# Patient Record
Sex: Female | Born: 1990 | Race: White | Hispanic: No | Marital: Single | Smoking: Current every day smoker
Health system: Southern US, Community
[De-identification: ages and names within clinical notes are randomized; demographics above are authoritative.]

## PROBLEM LIST (undated history)

## (undated) DIAGNOSIS — F32A Depression, unspecified: Secondary | ICD-10-CM

## (undated) DIAGNOSIS — F909 Attention-deficit hyperactivity disorder, unspecified type: Secondary | ICD-10-CM

## (undated) DIAGNOSIS — F419 Anxiety disorder, unspecified: Secondary | ICD-10-CM

## (undated) DIAGNOSIS — F329 Major depressive disorder, single episode, unspecified: Secondary | ICD-10-CM

## (undated) HISTORY — DX: Major depressive disorder, single episode, unspecified: F32.9

## (undated) HISTORY — DX: Depression, unspecified: F32.A

## (undated) HISTORY — DX: Anxiety disorder, unspecified: F41.9

## (undated) HISTORY — DX: Attention-deficit hyperactivity disorder, unspecified type: F90.9

## (undated) HISTORY — PX: NO PAST SURGERIES: SHX2092

---

## 2008-02-04 ENCOUNTER — Ambulatory Visit: Payer: Self-pay | Admitting: Internal Medicine

## 2009-08-05 ENCOUNTER — Ambulatory Visit: Payer: Self-pay | Admitting: Neurology

## 2010-09-13 ENCOUNTER — Ambulatory Visit: Payer: Self-pay | Admitting: Internal Medicine

## 2010-11-26 ENCOUNTER — Ambulatory Visit: Payer: Self-pay | Admitting: Otolaryngology

## 2011-01-02 ENCOUNTER — Ambulatory Visit: Payer: Self-pay | Admitting: Internal Medicine

## 2011-01-16 ENCOUNTER — Ambulatory Visit: Payer: Self-pay | Admitting: Internal Medicine

## 2011-12-14 ENCOUNTER — Ambulatory Visit: Payer: Self-pay | Admitting: Family Medicine

## 2012-01-29 ENCOUNTER — Emergency Department: Payer: Self-pay

## 2012-02-05 ENCOUNTER — Emergency Department: Payer: Self-pay | Admitting: Emergency Medicine

## 2014-10-11 DIAGNOSIS — F332 Major depressive disorder, recurrent severe without psychotic features: Secondary | ICD-10-CM | POA: Insufficient documentation

## 2014-10-11 DIAGNOSIS — F988 Other specified behavioral and emotional disorders with onset usually occurring in childhood and adolescence: Secondary | ICD-10-CM | POA: Insufficient documentation

## 2014-12-06 ENCOUNTER — Encounter: Payer: Self-pay | Admitting: Psychiatry

## 2014-12-06 ENCOUNTER — Ambulatory Visit (INDEPENDENT_AMBULATORY_CARE_PROVIDER_SITE_OTHER): Payer: BLUE CROSS/BLUE SHIELD | Admitting: Psychiatry

## 2014-12-06 VITALS — BP 124/88 | HR 62 | Temp 98.2°F | Ht 66.5 in | Wt 240.8 lb

## 2014-12-06 DIAGNOSIS — F329 Major depressive disorder, single episode, unspecified: Secondary | ICD-10-CM | POA: Insufficient documentation

## 2014-12-06 DIAGNOSIS — F1910 Other psychoactive substance abuse, uncomplicated: Secondary | ICD-10-CM | POA: Insufficient documentation

## 2014-12-06 DIAGNOSIS — F331 Major depressive disorder, recurrent, moderate: Secondary | ICD-10-CM | POA: Insufficient documentation

## 2014-12-06 DIAGNOSIS — F411 Generalized anxiety disorder: Secondary | ICD-10-CM

## 2014-12-06 DIAGNOSIS — F902 Attention-deficit hyperactivity disorder, combined type: Secondary | ICD-10-CM | POA: Diagnosis not present

## 2014-12-06 MED ORDER — BUPROPION HCL ER (XL) 300 MG PO TB24: 300.0000 mg | ORAL_TABLET | Freq: Every day | ORAL | Status: DC

## 2014-12-06 MED ORDER — AMPHETAMINE-DEXTROAMPHET ER 15 MG PO CP24: 15.0000 mg | ORAL_CAPSULE | Freq: Two times a day (BID) | ORAL | Status: DC

## 2014-12-06 MED ORDER — CITALOPRAM HYDROBROMIDE 20 MG PO TABS: 20.0000 mg | ORAL_TABLET | Freq: Every day | ORAL | Status: DC
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
## 2014-12-09 ENCOUNTER — Other Ambulatory Visit: Payer: Self-pay

## 2014-12-09 NOTE — Telephone Encounter (Signed)
spoke with [facility] in regards to getting prior authorization needed for the adderall xr 15mg  take 1 capsule by mouth twice daily with breakfast and lunch and per the insurance they will not authorized because there is not enough documentation on office note to support why patient needs medication , what other medications the patient has tired and why patient needs to take the medication twice a day instead of giving patient adderall xr 30mg  i po qd.   I had previously sent in the last three office notes and still the documentation was not supportive.  I have updated the adderall xr to the 30 mg taking 1 po qd (ready if you want to send in that order and see if it is approved them.    Please advise.

## 2014-12-15 NOTE — Telephone Encounter (Signed)
Yes, please. I do not see any other solution to this. She has said that the 30 mg strength once a day makes her feel bad but it seems to be the best we can do. I am happy to sign the prescription and if it gets printed out and we can send it to her if she is willing to try it.

## 2014-12-15 NOTE — Telephone Encounter (Signed)
was able to get a prior [place] appealed and it was approved for the 15mg  twice daily.

## 2014-12-28 ENCOUNTER — Telehealth: Payer: Self-pay | Admitting: Psychiatry

## 2015-01-09 NOTE — Telephone Encounter (Signed)
adderall xr  was approved -

## 2015-05-01 ENCOUNTER — Other Ambulatory Visit: Payer: Self-pay | Admitting: Psychiatry

## 2015-05-01 MED ORDER — BUPROPION HCL ER (XL) 300 MG PO TB24: 300.0000 mg | ORAL_TABLET | Freq: Every day | ORAL | Status: DC

## 2015-05-04 ENCOUNTER — Ambulatory Visit (INDEPENDENT_AMBULATORY_CARE_PROVIDER_SITE_OTHER): Payer: BLUE CROSS/BLUE SHIELD | Admitting: Psychiatry

## 2015-05-04 ENCOUNTER — Encounter: Payer: Self-pay | Admitting: Psychiatry

## 2015-05-04 VITALS — BP 148/98 | HR 89 | Temp 98.4°F | Ht 66.5 in | Wt 239.8 lb

## 2015-05-04 DIAGNOSIS — F411 Generalized anxiety disorder: Secondary | ICD-10-CM | POA: Diagnosis not present

## 2015-05-04 DIAGNOSIS — F902 Attention-deficit hyperactivity disorder, combined type: Secondary | ICD-10-CM | POA: Diagnosis not present

## 2015-05-04 DIAGNOSIS — F331 Major depressive disorder, recurrent, moderate: Secondary | ICD-10-CM | POA: Diagnosis not present

## 2015-05-04 MED ORDER — AMPHETAMINE-DEXTROAMPHET ER 15 MG PO CP24: 15.0000 mg | ORAL_CAPSULE | Freq: Two times a day (BID) | ORAL | Status: DC

## 2015-05-04 MED ORDER — CITALOPRAM HYDROBROMIDE 20 MG PO TABS: 40.0000 mg | ORAL_TABLET | Freq: Every day | ORAL | Status: DC

## 2015-05-17 ENCOUNTER — Telehealth: Payer: Self-pay

## 2015-05-17 NOTE — Telephone Encounter (Signed)
left message asking patient to call office back in regards to how her blood pressure was doing.  when patient came into office her bp was high 148/ 98.

## 2015-05-18 NOTE — Progress Notes (Signed)
[facility] MD Progress Note  05/18/2015 4:55 PM Hannah Mendez  MRN:  [id number] Subjective:  Follow-up for 24 year old woman with multiple complaints of depression and anxiety as well as chronic ADHD. Depression has been worse. Feeling more down and sad. Also irritable. Having a little trouble sleeping at night. Increased anxiety. No suicidal or homicidal ideation. No sign of psychotic thinking. Her medication for ADHD is not ideal but as far as what her insurance will cover it seems to be working fairly well. No other new physical complaints. She is feeling socially frustrated with her current job and personal situation Principal Problem: @ Diagnosis:   Patient Active Problem List   Diagnosis Date Noted  . 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 Depression, major, recurrent, moderate (HCC) [F33.1] 12/06/2014  . Anxiety, generalized [F41.1] 12/06/2014  . 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 ADD (attention deficit disorder) [F90.9] 10/11/2014  . Major depressive disorder, recurrent severe without psychotic features (HCC) [F33.2] 10/11/2014   Total Time spent with patient: 25 minutes  Past Psychiatric History: Past history of chronic depression and anxiety response well to medication of suicide attempts  Past Medical History:  Past Medical History  Diagnosis Date  . ADHD (attention deficit hyperactivity disorder)   . Anxiety   . Depression    History reviewed. No pertinent past surgical history. Family History:  Family History  Problem Relation Age of Onset  . Anxiety disorder Mother   . Depression Mother   . OCD Mother   . Thyroid disease Maternal Aunt   . Thyroid disease Maternal Grandmother    Family Psychiatric  History: Family history of anxiety Social History:  History  Alcohol Use  . 26.4 oz/week  . 0 Standard drinks or equivalent, 14 Glasses of wine, 24 Cans of beer, 6 Shots of liquor per week     History  Drug Use  . Yes   . Special: Marijuana    Comment: EVERYNIGHT    Social History   Social History  . Marital Status: Single    Spouse Name: N/A  . Number of Children: N/A  . Years of Education: N/A   Social History Main Topics  . Smoking status: Current Every Day Smoker -- 0.50 packs/day    Types: Cigarettes    Start date: 12/05/2009  . Smokeless tobacco: Never Used  . Alcohol Use: 26.4 oz/week    0 Standard drinks or equivalent, 14 Glasses of wine, 24 Cans of beer, 6 Shots of liquor per week  . Drug Use: Yes    Special: Marijuana     Comment: EVERYNIGHT  . Sexual Activity: No   Other Topics Concern  . None   Social History Narrative   Additional Social History:                         Sleep: Good  Appetite:  Good  Current Medications: Current Outpatient Prescriptions  Medication Sig Dispense Refill  . amphetamine-dextroamphetamine (ADDERALL XR) 15 MG 24 hr capsule Take 1 capsule by mouth 2 (two) times daily with breakfast and lunch. 60 capsule 0  . buPROPion (WELLBUTRIN XL) 300 MG 24 hr tablet Take 1 tablet (300 mg total) by mouth daily. 30 tablet 3  . citalopram (CELEXA) 20 MG tablet Take 2 tablets (40 mg total) by mouth daily. 1 (one) Tablet per day plus one per day extra during menstral period 60 tablet 3  . amphetamine-dextroamphetamine (ADDERALL XR) 15 MG  24 hr capsule Take 1 capsule by mouth 2 (two) times daily with breakfast and lunch. (Patient not taking: Reported on 05/04/2015) 60 capsule 0  . amphetamine-dextroamphetamine (ADDERALL XR) 15 MG 24 hr capsule Take 1 capsule by mouth 2 (two) times daily with breakfast and lunch. (Patient not taking: Reported on 05/04/2015) 60 capsule 0  . amphetamine-dextroamphetamine (ADDERALL XR) 20 MG 24 hr capsule Take 1 capsule by mouth daily.     No current facility-administered medications for this visit.    Lab Results: No results found for this or any previous visit (from the past 48 hour(s)).  Physical Findings: AIMS:  , ,   ,  ,    CIWA:    COWS:     Musculoskeletal: Strength & Muscle Tone: within normal limits Gait & Station: normal Patient leans: N/A  Psychiatric Specialty Exam: ROS  Blood pressure 148/98, pulse 89, temperature 98.4 F (36.9 C), temperature source Tympanic, height 5' 6.5" (1.689 m), weight 239 lb 12.8 oz (108.773 kg), last menstrual period 04/03/2015, SpO2 96 %.Body mass index is 38.13 kg/(m^2).  General Appearance: Casual  Eye Contact::  Good  Speech:  Clear and Coherent  Volume:  Normal  Mood:  Dysphoric  Affect:  Depressed  Thought Process:  Coherent  Orientation:  Full (Time, Place, and Person)  Thought Content:  Negative  Suicidal Thoughts:  No  Homicidal Thoughts:  No  Memory:  Immediate;   Good Recent;   Good Remote;   Good  Judgement:  Fair  Insight:  Fair  Psychomotor Activity:  Decreased  Concentration:  Fair  Recall:  [employer]  Fund of Knowledge:Fair  Language: Fair  Akathisia:  No  Handed:  Right  AIMS (if indicated):     Assets:  Communication Skills Desire for Improvement Leisure Time Physical Health Resilience  ADL's:  Intact  Cognition: WNL  Sleep:      Treatment Plan Summary: Medication management and Plan reviewed medication plan. Her depression is getting worse. Psychoeducation completed. Encouraged her to start seeing a therapist again. Add citalopram and her Wellbutrin and continue that for her depression.  Her ADHD is stable continue current medicine. Review the appropriate timing of doses.Follow-up in 3 months.Anxiety level is been worse. Citalopram may help with that. Again encouraged her to see a therapist.  Mordecai RasmussenJohn  05/18/2015, 4:55 PM

## 2015-05-18 NOTE — Telephone Encounter (Signed)
left another message for patient to call our office back.

## 2015-05-29 NOTE — Telephone Encounter (Signed)
left message for patient to call us back in regards to her blood pressure asked pt to please call our office back

## 2015-08-03 ENCOUNTER — Ambulatory Visit (INDEPENDENT_AMBULATORY_CARE_PROVIDER_SITE_OTHER): Payer: BLUE CROSS/BLUE SHIELD | Admitting: Psychiatry

## 2015-08-03 ENCOUNTER — Encounter: Payer: Self-pay | Admitting: Psychiatry

## 2015-08-03 VITALS — BP 140/90 | HR 94 | Temp 98.5°F | Ht 66.5 in | Wt 242.8 lb

## 2015-08-03 DIAGNOSIS — F902 Attention-deficit hyperactivity disorder, combined type: Secondary | ICD-10-CM | POA: Diagnosis not present

## 2015-08-03 DIAGNOSIS — F411 Generalized anxiety disorder: Secondary | ICD-10-CM | POA: Diagnosis not present

## 2015-08-03 DIAGNOSIS — F331 Major depressive disorder, recurrent, moderate: Secondary | ICD-10-CM | POA: Diagnosis not present

## 2015-08-03 MED ORDER — AMPHETAMINE-DEXTROAMPHET ER 10 MG PO CP24: 10.0000 mg | ORAL_CAPSULE | Freq: Two times a day (BID) | ORAL | Status: DC

## 2015-08-03 MED ORDER — BUPROPION HCL ER (XL) 300 MG PO TB24: 300.0000 mg | ORAL_TABLET | Freq: Every day | ORAL | Status: DC

## 2015-08-03 MED ORDER — CITALOPRAM HYDROBROMIDE 20 MG PO TABS: 20.0000 mg | ORAL_TABLET | Freq: Every day | ORAL | Status: DC

## 2015-08-04 NOTE — Progress Notes (Signed)
[facility] MD Progress Note  08/04/2015 4:31 PM Hannah Mendez  MRN:  [id number] Subjective:  She reports in some ways feeling more anxious. More jittery at times. Depression is perhaps better and panic attacks are better but increasing the citalopram seems to of made her more nervous. She still feels that the Adderall dose is not correct. No suicidal thoughts but overall a lot of nervousness and seems unhappy. Complains of not being able to get things done. She knows that she needs to see a therapist. Principal Problem: @ Diagnosis:   Patient Active Problem List   Diagnosis Date Noted  . 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 Depression, major, recurrent, moderate (HCC) [F33.1] 12/06/2014  . Anxiety, generalized [F41.1] 12/06/2014  . 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 ADD (attention deficit disorder) [F90.9] 10/11/2014  . Major depressive disorder, recurrent severe without psychotic features (HCC) [F33.2] 10/11/2014   Total Time spent with patient: 30 minutes  Past Psychiatric History: patient has a history of ADHD chronic recurrent depression history of cannabis abuse ongoing  Past Medical History:  Past Medical History  Diagnosis Date  . ADHD (attention deficit hyperactivity disorder)   . Anxiety   . Depression    History reviewed. No pertinent past surgical history. Family History:  Family History  Problem Relation Age of Onset  . Anxiety disorder Mother   . Depression Mother   . OCD Mother   . Thyroid disease Maternal Aunt   . Thyroid disease Maternal Grandmother    Family Psychiatric  History: family history of some anxiety no severe illness Social History:  History  Alcohol Use  . 26.4 oz/week  . 0 Standard drinks or equivalent, 14 Glasses of wine, 24 Cans of beer, 6 Shots of liquor per week     History  Drug Use  . Yes  . Special: Marijuana    Comment: EVERYNIGHT    Social History   Social History  . Marital  Status: Single    Spouse Name: N/A  . Number of Children: N/A  . Years of Education: N/A   Social History Main Topics  . Smoking status: Current Every Day Smoker -- 0.50 packs/day    Types: Cigarettes    Start date: 12/05/2009  . Smokeless tobacco: Never Used  . Alcohol Use: 26.4 oz/week    0 Standard drinks or equivalent, 14 Glasses of wine, 24 Cans of beer, 6 Shots of liquor per week  . Drug Use: Yes    Special: Marijuana     Comment: EVERYNIGHT  . Sexual Activity: No   Other Topics Concern  . None   Social History Narrative   Additional Social History:                         Sleep: Fair  Appetite:  Fair  Current Medications: Current Outpatient Prescriptions  Medication Sig Dispense Refill  . amphetamine-dextroamphetamine (ADDERALL XR) 10 MG 24 hr capsule Take 1 capsule (10 mg total) by mouth 2 (two) times daily with breakfast and lunch. 60 capsule 0  . buPROPion (WELLBUTRIN XL) 300 MG 24 hr tablet Take 1 tablet (300 mg total) by mouth daily. 30 tablet 3  . cetirizine (ZYRTEC) 10 MG tablet Take 10 mg by mouth daily.    . citalopram (CELEXA) 20 MG tablet Take 1 tablet (20 mg total) by mouth daily. 1 (one) Tablet per day plus one per day extra during menstral period 30 tablet  3   No current facility-administered medications for this visit.    Lab Results: No results found for this or any previous visit (from the past 48 hour(s)).  Blood Alcohol level:  No results found for: [facility]  Physical Findings: AIMS:  , ,  ,  ,    CIWA:    COWS:     Musculoskeletal: Strength & Muscle Tone: within normal limits Gait & Station: normal Patient leans: N/A  Psychiatric Specialty Exam: ROS  Blood pressure 140/90, pulse 94, temperature 98.5 F (36.9 C), temperature source Tympanic, height 5' 6.5" (1.689 m), weight 242 lb 12.8 oz (110.133 kg), last menstrual period 07/03/2015, SpO2 95 %.Body mass index is 38.61 kg/(m^2).  General Appearance: Casual  Eye Contact::   Good  Speech:  Normal Rate  Volume:  Normal  Mood:  Dysphoric  Affect:  Flat  Thought Process:  Coherent  Orientation:  Full (Time, Place, and Person)  Thought Content:  Negative  Suicidal Thoughts:  No  Homicidal Thoughts:  No  Memory:  Immediate;   Good Recent;   Good Remote;   Good  Judgement:  Good  Insight:  Fair  Psychomotor Activity:  Decreased  Concentration:  Fair  Recall:  [employer] of Knowledge:Fair  Language: Fair  Akathisia:  Negative  Handed:  Right  AIMS (if indicated):     Assets:  Communication Skills Desire for Improvement Financial Resources/Insurance Housing Physical Health Resilience  ADL's:  Intact  Cognition: WNL  Sleep:      Treatment Plan Summary: Medication management and Plan patient is strongly encouraged to get into seeing a therapist. Meanwhile we will change her Adderall by cutting back a little bit to 10 mg twice a day since she currently thinks the 15 twice a day is too much. We will cut back the citalopram to 20 mg again. I'm not going to add anything else. She implied that she wanted me to add Xanax but given her already regular use of marijuana and multiple drugs I'm very uncomfortable with that. Follow-up in 3 months.  Mordecai Rasmussen, MD 08/04/2015, 4:31 PM

## 2015-10-10 ENCOUNTER — Other Ambulatory Visit: Payer: Self-pay

## 2015-10-10 NOTE — Telephone Encounter (Signed)
pt called left a message that she needs a refill on adderall.

## 2015-10-12 ENCOUNTER — Telehealth: Payer: Self-pay

## 2015-10-12 NOTE — Telephone Encounter (Signed)
RECEIVED FAX STATIN THAT ADDERALL XR 10MG  WAS APPROVED EFFECTIVE DATES 10-09-15 TO  10-07-16

## 2015-12-13 ENCOUNTER — Other Ambulatory Visit: Payer: Self-pay | Admitting: Psychiatry

## 2015-12-13 MED ORDER — AMPHETAMINE-DEXTROAMPHET ER 10 MG PO CP24: 10.0000 mg | ORAL_CAPSULE | Freq: Two times a day (BID) | ORAL | Status: DC

## 2015-12-13 NOTE — Telephone Encounter (Signed)
I am printing a prescription. I will sign it and we can mail it to her

## 2015-12-14 NOTE — Telephone Encounter (Signed)
mailed out certified return receipt today  12-14-15 tracking # [id number]

## 2015-12-26 ENCOUNTER — Other Ambulatory Visit: Payer: Self-pay | Admitting: Psychiatry

## 2016-01-21 ENCOUNTER — Other Ambulatory Visit: Payer: Self-pay | Admitting: Psychiatry

## 2016-01-22 ENCOUNTER — Telehealth: Payer: Self-pay

## 2016-01-22 NOTE — Telephone Encounter (Signed)
pt called need refill on medications.

## 2016-01-22 NOTE — Telephone Encounter (Signed)
called in rx for wellbutrin and celexa.  pt still needs rx for adderall

## 2016-01-28 ENCOUNTER — Other Ambulatory Visit: Payer: Self-pay | Admitting: Psychiatry

## 2016-01-28 MED ORDER — AMPHETAMINE-DEXTROAMPHET ER 10 MG PO CP24: 10.0000 mg | ORAL_CAPSULE | Freq: Two times a day (BID) | ORAL | 0 refills | Status: DC

## 2016-01-29 NOTE — Telephone Encounter (Signed)
no answer could not leave a voice message.  dr. Toni Amendclapacs did 3 rx for adderall xr 10mg .  id [id number] order  [id number],  [id number] order # [id number] not to be filled until after 02-26-16, and id # [id number] order # [id number] not to be filled until after 10-10

## 2016-02-12 ENCOUNTER — Ambulatory Visit (INDEPENDENT_AMBULATORY_CARE_PROVIDER_SITE_OTHER): Payer: BLUE CROSS/BLUE SHIELD | Admitting: Psychiatry

## 2016-02-12 ENCOUNTER — Encounter: Payer: Self-pay | Admitting: Psychiatry

## 2016-02-12 VITALS — BP 136/89 | HR 77 | Temp 98.8°F | Ht 66.5 in | Wt 236.8 lb

## 2016-02-12 DIAGNOSIS — F902 Attention-deficit hyperactivity disorder, combined type: Secondary | ICD-10-CM | POA: Diagnosis not present

## 2016-02-12 DIAGNOSIS — F411 Generalized anxiety disorder: Secondary | ICD-10-CM

## 2016-02-12 MED ORDER — CITALOPRAM HYDROBROMIDE 20 MG PO TABS: ORAL_TABLET | ORAL | 5 refills | Status: DC

## 2016-02-12 MED ORDER — AMPHETAMINE-DEXTROAMPHET ER 10 MG PO CP24: 10.0000 mg | ORAL_CAPSULE | Freq: Two times a day (BID) | ORAL | 0 refills | Status: DC

## 2016-02-12 MED ORDER — BUPROPION HCL ER (XL) 300 MG PO TB24: ORAL_TABLET | ORAL | 5 refills | Status: DC

## 2016-02-12 NOTE — Progress Notes (Signed)
Follow-up for patient with ADHD and chronic anxiety and dysthymia. Mood has been good recently. Coping with work stress and personal stress well. No major spells of depression. Physically she is feeling well with no new medical problems. Concentration and focus are well controlled and stable. Patient is alert and oriented. Neatly dressed. Good eye contact. Appropriate affect and interaction. Lucid thinking without loosening of associations. Short and long-term memory intact. Good insight and judgment.  Continue current Adderall 3 months total along with Wellbutrin and Celexa. Review uses and side effects of medicine. Follow-up 6 months.

## 2016-02-20 ENCOUNTER — Ambulatory Visit
Admission: EM | Admit: 2016-02-20 | Discharge: 2016-02-20 | Disposition: A | Discharge status: Home / Self Care | Payer: BLUE CROSS/BLUE SHIELD | Attending: Family Medicine | Admitting: Family Medicine

## 2016-02-20 ENCOUNTER — Ambulatory Visit (INDEPENDENT_AMBULATORY_CARE_PROVIDER_SITE_OTHER): Payer: BLUE CROSS/BLUE SHIELD

## 2016-02-20 DIAGNOSIS — S61236A Puncture wound without foreign body of right little finger without damage to nail, initial encounter: Secondary | ICD-10-CM

## 2016-02-20 MED ORDER — TETANUS-DIPHTH-ACELL PERTUSSIS 5-2.5-18.5 LF-MCG/0.5 IM SUSP
0.5000 mL | Freq: Once | INTRAMUSCULAR | Status: AC
  Administered 2016-02-20: 0.5 mL via INTRAMUSCULAR

## 2016-02-20 MED ORDER — CEPHALEXIN 500 MG PO CAPS: 500.0000 mg | ORAL_CAPSULE | Freq: Four times a day (QID) | ORAL | 0 refills | Status: AC

## 2016-02-20 NOTE — Discharge Instructions (Signed)
Take medication as prescribed. Rest. Drink plenty of fluids. Keep clean. Clean multiple times per day as discussed.   Follow up with your primary care physician this week as needed. Return to Urgent care for new or worsening concerns.

## 2016-02-20 NOTE — ED Provider Notes (Signed)
[facility] ____________________________________________  Time seen: Approximately 10:41 AM  I have reviewed the triage vital signs and the nursing notes.   HISTORY  Chief Complaint Puncture Wound   HPI Hannah Mendez is a 25 y.o. female presents for evaluation of injury to right hand that occurred last night at home. Patient reports she was loading her dishwasher and reached down, and accidentally hit a fork that was sitting upwards. Patient reports that the prong of the fork went into her skin at her pinky finger. Patient reports that she has since cleaned multiple times at home. Reports this occurred last night. Unsure of last tetanus immunization.  States mild pain directly at puncture wound site. Denies any other pain or injury. Denies any numbness or tingling sensation. Denies any decreased range of motion or other pain. Denies fevers. Reports feels well otherwise. Denies any head injury or loss of consciousness. Reports a few weeks ago she cut part of the tip of right pinky finger as well, but reports no pain and healing well.   Patient's last menstrual period was 02/02/2016 (within days).   Sherlene Shams, MD PCP   Past Medical History:  Diagnosis Date  . ADHD (attention deficit hyperactivity disorder)   . Anxiety   . Depression     Patient Active Problem List   Diagnosis Date Noted  . Affective disorder, major (HCC) 12/06/2014  . Depression, major, recurrent, moderate (HCC) 12/06/2014  . Anxiety, generalized 12/06/2014  . Mixed, or nondependent drug abuse, episodic 12/06/2014  . ADD (attention deficit disorder) 10/11/2014  . Major depressive disorder, recurrent severe without psychotic features (HCC) 10/11/2014    Past Surgical History:  Procedure Laterality Date  . NO PAST SURGERIES      No current facility-administered medications for this encounter.   Current Outpatient Prescriptions:  .  triamcinolone (NASACORT ALLERGY 24HR CHILDREN) 55  MCG/ACT AERO nasal inhaler, Place 2 sprays into the nose daily., Disp: , Rfl:  .  amphetamine-dextroamphetamine (ADDERALL XR) 10 MG 24 hr capsule, Take 1 capsule (10 mg total) by mouth 2 (two) times daily with breakfast and lunch., Disp: 60 capsule, Rfl: 0 .  amphetamine-dextroamphetamine (ADDERALL XR) 10 MG 24 hr capsule, Take 1 capsule (10 mg total) by mouth 2 (two) times daily with breakfast and lunch., Disp: 60 capsule, Rfl: 0 .  amphetamine-dextroamphetamine (ADDERALL XR) 10 MG 24 hr capsule, Take 1 capsule (10 mg total) by mouth 2 (two) times daily with breakfast and lunch., Disp: 60 capsule, Rfl: 0 .  amphetamine-dextroamphetamine (ADDERALL XR) 10 MG 24 hr capsule, Take 1 capsule (10 mg total) by mouth 2 (two) times daily with breakfast and lunch., Disp: 60 capsule, Rfl: 0 .  buPROPion (WELLBUTRIN XL) 300 MG 24 hr tablet, TAKE 1 TABLET(300 MG) BY MOUTH DAILY, Disp: 30 tablet, Rfl: 0 .  buPROPion (WELLBUTRIN XL) 300 MG 24 hr tablet, TAKE 1 TABLET(300 MG) BY MOUTH DAILY, Disp: 30 tablet, Rfl: 5 .  cephALEXin (KEFLEX) 500 MG capsule, Take 1 capsule (500 mg total) by mouth 4 (four) times daily., Disp: 28 capsule, Rfl: 0 .  cetirizine (ZYRTEC) 10 MG tablet, Take 10 mg by mouth daily., Disp: , Rfl:  .  citalopram (CELEXA) 20 MG tablet, TAKE 1 TABLET BY MOUTH DAILY, PLUS 1 TABLET PER DAY DURING MENSTRUAL PERIOD, Disp: 30 tablet, Rfl: 5  Allergies Review of patient's allergies indicates no known allergies.  Family History  Problem Relation Age of Onset  . Anxiety disorder Mother   . Depression  Mother   . OCD Mother   . Thyroid disease Maternal Grandmother   . Thyroid disease Maternal Aunt     Social History Social History  Substance Use Topics  . Smoking status: Current Every Day Smoker    Packs/day: 0.50    Types: Cigarettes    Start date: 12/05/2009  . Smokeless tobacco: Never Used  . Alcohol use 26.4 oz/week    14 Glasses of wine, 24 Cans of beer, 6 Shots of liquor per week     Review of Systems Constitutional: No fever/chills Eyes: No visual changes. ENT: No sore throat. Cardiovascular: Denies chest pain. Respiratory: Denies shortness of breath. Gastrointestinal: No abdominal pain.  No nausea, no vomiting.  No diarrhea.  No constipation. Genitourinary: Negative for dysuria. Musculoskeletal: Negative for back pain. Skin: Negative for rash. Neurological: Negative for headaches, focal weakness or numbness.  10-point ROS otherwise negative.  ____________________________________________   PHYSICAL EXAM:  VITAL SIGNS: ED Triage Vitals  Enc Vitals Group     BP 02/20/16 1026 124/83     Pulse Rate 02/20/16 1026 77     Resp 02/20/16 1026 16     Temp 02/20/16 1026 97.9 F (36.6 C)     Temp Source 02/20/16 1026 Tympanic     SpO2 02/20/16 1026 100 %     Weight 02/20/16 1024 236 lb 1.9 oz (107.1 kg)     Height 02/20/16 1024 5' 6.5" (1.689 m)     Head Circumference --      Peak Flow --      Pain Score 02/20/16 1026 1     Pain Loc --      Pain Edu? --      Excl. in GC? --     Constitutional: Alert and oriented. Well appearing and in no acute distress. Eyes: Conjunctivae are normal. PERRL. EOMI. ENT      Head: Normocephalic and atraumatic.      Mouth/Throat: Mucous membranes are moist. Neck: No stridor. Supple without meningismus.  Hematological/Lymphatic/Immunilogical: No cervical lymphadenopathy. Cardiovascular: Normal rate, regular rhythm. Grossly normal heart sounds.  Good peripheral circulation. Respiratory: Normal respiratory effort without tachypnea nor retractions. Breath sounds are clear and equal bilaterally. No wheezes/rales/rhonchi.. Musculoskeletal:  Ambulatory with steady gait. No midline cervical, thoracic or lumbar tenderness to palpation. See skin below. Neurologic:  Normal speech and language. No gross focal neurologic deficits are appreciated. Speech is normal. No gait instability.  Skin:  Skin is warm, dry and intact. No rash  noted. Except: 0.5 cm puncture wound noted at medial dorsal base of fifth proximal phalanx, with mild tenderness to puncture site as well as that fifth MCP, right fifth digit otherwise nontender, right fifth digit with full range of motion and no motor or tendon deficit, right hand otherwise nontender, right hand with normal movement and normal capillary refill. Psychiatric: Mood and affect are normal. Speech and behavior are normal. Patient exhibits appropriate insight and judgment   ___________________________________________   LABS (all labs ordered are listed, but only abnormal results are displayed)  Labs Reviewed - No data to display  RADIOLOGY  Dg Finger Little Right  Result Date: 02/20/2016 CLINICAL DATA:  Puncture wound in the posterior metacarpophalangeal joint region EXAM: RIGHT LITTLE FINGER 2+V COMPARISON:  None in PACs FINDINGS: The bones are adequately mineralized. The joint spaces are preserved. There is subtle irregularity of the tip of the fifth finger consistent with recent injury. In the MCP joint region no bony abnormalities are observed. No soft tissue foreign  bodies are observed either. IMPRESSION: There is no acute soft tissue abnormality in the region of the puncture wound in the metacarpophalangeal joint region. No bony abnormality is observed. Electronically Signed   By: David  [place] M.D.   On: 02/20/2016 10:57   ____________________________________________   PROCEDURES Procedures  Patient denies need for splinting.  INITIAL IMPRESSION / ASSESSMENT AND PLAN / ED COURSE  Pertinent labs & imaging results that were available during my care of the patient were reviewed by me and considered in my medical decision making (see chart for details).  Well-appearing patient. No acute distress. Presenting for puncture wound to right hand after last night. Per radiologist's right hand x-ray no acute soft tissue abnormality in the region of the puncture wound in the  metacarpophalangeal phalangeal joint region, no bony abnormality is observed. Counseled regarding wound care and cleaning. Topical antibiotics. Will place patient on oral cephalexin. Encourage close monitoring and follow-up as needed.Discussed indication, risks and benefits of medications with patient. Tetanus immunization updated.  Discussed follow up with Primary care physician this week. Discussed follow up and return parameters including no resolution or any worsening concerns. Patient verbalized understanding and agreed to plan.   ____________________________________________   FINAL CLINICAL IMPRESSION(S) / ED DIAGNOSES  Final diagnoses:  Puncture wound of fifth finger of right hand, initial encounter     Discharge Medication List as of 02/20/2016 11:06 AM    START taking these medications   Details  cephALEXin (KEFLEX) 500 MG capsule Take 1 capsule (500 mg total) by mouth 4 (four) times daily., Starting Tue 02/20/2016, Until Tue 02/27/2016, Normal        Note: This dictation was prepared with Dragon dictation along with smaller phrase technology. Any transcriptional errors that result from this process are unintentional.    Clinical Course      Renford Dills, NP 02/20/16 1218    Renford Dills, NP 02/20/16 [phone]

## 2016-02-20 NOTE — ED Triage Notes (Signed)
Patient states that yesterday she was pulling out a fork of the dishwasher and it stabbed in to her right pinky finger. Patient states that the fork went in about 1 inch.

## 2016-08-27 ENCOUNTER — Other Ambulatory Visit: Payer: Self-pay | Admitting: Psychiatry

## 2016-08-29 ENCOUNTER — Other Ambulatory Visit: Payer: Self-pay

## 2016-08-29 NOTE — Telephone Encounter (Signed)
pt called left message that she needs a refill on her medications she is out.  needs refill

## 2016-08-30 ENCOUNTER — Other Ambulatory Visit: Payer: Self-pay | Admitting: Psychiatry

## 2016-08-31 ENCOUNTER — Other Ambulatory Visit: Payer: Self-pay | Admitting: Psychiatry

## 2016-09-05 ENCOUNTER — Ambulatory Visit (INDEPENDENT_AMBULATORY_CARE_PROVIDER_SITE_OTHER): Payer: BLUE CROSS/BLUE SHIELD | Admitting: Psychiatry

## 2016-09-05 ENCOUNTER — Encounter: Payer: Self-pay | Admitting: Psychiatry

## 2016-09-05 VITALS — BP 149/87 | HR 80 | Temp 98.5°F | Wt 261.6 lb

## 2016-09-05 DIAGNOSIS — F902 Attention-deficit hyperactivity disorder, combined type: Secondary | ICD-10-CM

## 2016-09-05 DIAGNOSIS — F411 Generalized anxiety disorder: Secondary | ICD-10-CM

## 2016-09-05 MED ORDER — AMPHETAMINE-DEXTROAMPHET ER 10 MG PO CP24: 10.0000 mg | ORAL_CAPSULE | Freq: Two times a day (BID) | ORAL | 0 refills | Status: DC

## 2016-09-05 MED ORDER — CITALOPRAM HYDROBROMIDE 20 MG PO TABS: ORAL_TABLET | ORAL | 3 refills | Status: DC

## 2016-09-05 MED ORDER — BUPROPION HCL ER (XL) 300 MG PO TB24: 300.0000 mg | ORAL_TABLET | Freq: Every day | ORAL | 3 refills | Status: DC

## 2016-09-05 NOTE — Progress Notes (Signed)
Patient has no new complaints. Mood is been stable. Denies suicidal or homicidal ideation. Affect euthymic.  Neatly dressed and groomed. No physical complaints. No sign of psychosis or agitation.  Review medication. Renew everything follow-up in 3 months.

## 2016-09-10 ENCOUNTER — Telehealth: Payer: Self-pay

## 2016-09-10 NOTE — Telephone Encounter (Signed)
prior auth was approved for the adderall xr 10mg  take 1 capsule by mouth twice daily .  approved from  09-09-16 to 09-10-2019.  auth approval was faxed and confirmed to the pharmacy.

## 2016-09-16 NOTE — Telephone Encounter (Signed)
Thank you :)

## 2016-09-30 MED ORDER — CITALOPRAM HYDROBROMIDE 20 MG PO TABS: ORAL_TABLET | ORAL | 0 refills | Status: DC

## 2016-09-30 MED ORDER — BUPROPION HCL ER (XL) 300 MG PO TB24: ORAL_TABLET | ORAL | 0 refills | Status: DC

## 2016-12-05 ENCOUNTER — Ambulatory Visit (INDEPENDENT_AMBULATORY_CARE_PROVIDER_SITE_OTHER): Payer: No Typology Code available for payment source | Admitting: Psychiatry

## 2016-12-05 ENCOUNTER — Encounter: Payer: Self-pay | Admitting: Psychiatry

## 2016-12-05 VITALS — BP 145/89 | HR 87 | Temp 98.7°F | Wt 261.2 lb

## 2016-12-05 DIAGNOSIS — F902 Attention-deficit hyperactivity disorder, combined type: Secondary | ICD-10-CM | POA: Diagnosis not present

## 2016-12-05 DIAGNOSIS — F411 Generalized anxiety disorder: Secondary | ICD-10-CM | POA: Diagnosis not present

## 2016-12-05 MED ORDER — CITALOPRAM HYDROBROMIDE 20 MG PO TABS: ORAL_TABLET | ORAL | 5 refills | Status: DC

## 2016-12-05 MED ORDER — BUPROPION HCL ER (XL) 300 MG PO TB24: 300.0000 mg | ORAL_TABLET | Freq: Every day | ORAL | 5 refills | Status: DC

## 2016-12-05 MED ORDER — AMPHETAMINE-DEXTROAMPHET ER 10 MG PO CP24: 10.0000 mg | ORAL_CAPSULE | Freq: Two times a day (BID) | ORAL | 0 refills | Status: DC

## 2016-12-05 NOTE — Progress Notes (Signed)
Follow-up for 26 year old woman with history of ADHD and chronic depression. Patient's job has been stressful. Chronic anxiety. Medicine however continues to do well with helping with focus and patient is functioning relatively well. Sleeping well. No changed appetite. No side effects.  Neatly dressed and groomed. Good eye contact and normal psychomotor activity. Speech normal rate tone and volume. Denies suicidal or homicidal ideation.  Continue current medicine. 3 months of prescriptions for Adderall completed. Continue outpatient antidepressants follow-up 3 months.

## 2016-12-15 ENCOUNTER — Encounter: Payer: Self-pay | Admitting: Emergency Medicine

## 2016-12-15 ENCOUNTER — Emergency Department
Admission: EM | Admit: 2016-12-15 | Discharge: 2016-12-15 | Disposition: A | Discharge status: Home / Self Care | Payer: BLUE CROSS/BLUE SHIELD | Attending: Student in an Organized Health Care Education/Training Program | Admitting: Student in an Organized Health Care Education/Training Program

## 2016-12-15 DIAGNOSIS — Y9289 Other specified places as the place of occurrence of the external cause: Secondary | ICD-10-CM | POA: Diagnosis not present

## 2016-12-15 DIAGNOSIS — Y990 Civilian activity done for income or pay: Secondary | ICD-10-CM | POA: Diagnosis not present

## 2016-12-15 DIAGNOSIS — Y939 Activity, unspecified: Secondary | ICD-10-CM | POA: Diagnosis not present

## 2016-12-15 DIAGNOSIS — S61032A Puncture wound without foreign body of left thumb without damage to nail, initial encounter: Secondary | ICD-10-CM | POA: Insufficient documentation

## 2016-12-15 DIAGNOSIS — S6992XA Unspecified injury of left wrist, hand and finger(s), initial encounter: Secondary | ICD-10-CM | POA: Diagnosis present

## 2016-12-15 DIAGNOSIS — Z79899 Other long term (current) drug therapy: Secondary | ICD-10-CM | POA: Diagnosis not present

## 2016-12-15 DIAGNOSIS — W5501XA Bitten by cat, initial encounter: Secondary | ICD-10-CM

## 2016-12-15 DIAGNOSIS — F17210 Nicotine dependence, cigarettes, uncomplicated: Secondary | ICD-10-CM | POA: Insufficient documentation

## 2016-12-15 MED ORDER — AMOXICILLIN-POT CLAVULANATE 875-125 MG PO TABS: 1.0000 | ORAL_TABLET | Freq: Two times a day (BID) | ORAL | 0 refills | Status: DC

## 2016-12-15 NOTE — ED Provider Notes (Signed)
[facility] Emergency Department Provider Note  ____________________________________________  Time seen: Approximately 9:04 PM  I have reviewed the triage vital signs and the nursing notes.   HISTORY  Chief Complaint Animal Bite    HPI Hannah Mendez is a 26 y.o. female who presents to emergency department complaining of Bite to her left thumb. Patient reports that she was at work when she saw stray cat in the parking lot. She was afraid that it would be run over by a car. She picked it up and then the cat "panicked" and at the end of her left thumb. Patient reports that injuries are minor. No bleeding. Animal control was called and they have animal for evaluation. Patient reports that she had a friend with a similar situation and is just requesting antibiotics and she will wait until animal control gives her a call on whether to pursue rabies vaccination.   Past Medical History:  Diagnosis Date  . ADHD (attention deficit hyperactivity disorder)   . Anxiety   . Depression     Patient Active Problem List   Diagnosis Date Noted  . Affective disorder, major 12/06/2014  . Depression, major, recurrent, moderate (HCC) 12/06/2014  . Anxiety, generalized 12/06/2014  . Mixed, or nondependent drug abuse, episodic 12/06/2014  . ADD (attention deficit disorder) 10/11/2014  . Major depressive disorder, recurrent severe without psychotic features (HCC) 10/11/2014    Past Surgical History:  Procedure Laterality Date  . NO PAST SURGERIES      Prior to Admission medications   Medication Sig Start Date End Date Taking? Authorizing Provider  amoxicillin-clavulanate (AUGMENTIN) 875-125 MG tablet Take 1 tablet by mouth 2 (two) times daily. 12/15/16   Cuthriell, Delorise RoyalsJonathan D, PA-C  amphetamine-dextroamphetamine (ADDERALL XR) 10 MG 24 hr capsule Take 1 capsule (10 mg total) by mouth 2 (two) times daily with breakfast and lunch. 02/12/16   Clapacs, Jackquline DenmarkJohn T, MD   amphetamine-dextroamphetamine (ADDERALL XR) 10 MG 24 hr capsule Take 1 capsule (10 mg total) by mouth 2 (two) times daily with breakfast and lunch. 09/05/16   Clapacs, Jackquline DenmarkJohn T, MD  amphetamine-dextroamphetamine (ADDERALL XR) 10 MG 24 hr capsule Take 1 capsule (10 mg total) by mouth 2 (two) times daily with breakfast and lunch. 09/05/16   Clapacs, Jackquline DenmarkJohn T, MD  amphetamine-dextroamphetamine (ADDERALL XR) 10 MG 24 hr capsule Take 1 capsule (10 mg total) by mouth 2 (two) times daily with breakfast and lunch. 12/05/16   Clapacs, Jackquline DenmarkJohn T, MD  buPROPion (WELLBUTRIN XL) 300 MG 24 hr tablet TAKE 1 TABLET(300 MG) BY MOUTH DAILY 09/30/16   Clapacs, Jackquline DenmarkJohn T, MD  buPROPion (WELLBUTRIN XL) 300 MG 24 hr tablet TAKE 1 TABLET(300 MG) BY MOUTH DAILY 09/04/16   Clapacs, Jackquline DenmarkJohn T, MD  buPROPion (WELLBUTRIN XL) 300 MG 24 hr tablet Take 1 tablet (300 mg total) by mouth daily. 09/05/16   Clapacs, Jackquline DenmarkJohn T, MD  buPROPion (WELLBUTRIN XL) 300 MG 24 hr tablet Take 1 tablet (300 mg total) by mouth daily. 12/05/16   Clapacs, Jackquline DenmarkJohn T, MD  cetirizine (ZYRTEC) 10 MG tablet Take 10 mg by mouth daily.    [provider]  citalopram (CELEXA) 20 MG tablet TAKE 1 TABLET BY MOUTH EVERY DAY 09/04/16   Clapacs, Jackquline DenmarkJohn T, MD  citalopram (CELEXA) 20 MG tablet TAKE 1 TABLET BY MOUTH DAILY, PLUS 1 TABLET DAILY DURING MENSTRUAL PERIOD. 09/05/16   Clapacs, Jackquline DenmarkJohn T, MD  citalopram (CELEXA) 20 MG tablet TAKE 1 TABLET BY MOUTH DAILY, PLUS 1 TABLET  PER DAY DURING MENSTRUAL PERIOD 12/05/16   Clapacs, Jackquline Denmark, MD  triamcinolone (NASACORT ALLERGY 24HR CHILDREN) 55 MCG/ACT AERO nasal inhaler Place 2 sprays into the nose daily.    [provider]    Allergies Patient has no known allergies.  Family History  Problem Relation Age of Onset  . Anxiety disorder Mother   . Depression Mother   . OCD Mother   . Thyroid disease Maternal Grandmother   . Thyroid disease Maternal Aunt     Social History Social History  Substance Use Topics  . Smoking  status: Current Every Day Smoker    Packs/day: 0.50    Types: Cigarettes    Start date: 12/05/2009  . Smokeless tobacco: Never Used  . Alcohol use 26.4 oz/week    14 Glasses of wine, 24 Cans of beer, 6 Shots of liquor per week     Review of Systems  Constitutional: No fever/chills Eyes: No visual changes. No discharge ENT: No upper respiratory complaints. Cardiovascular: no chest pain. Respiratory: no cough. No SOB. Gastrointestinal: No abdominal pain.  No nausea, no vomiting.   Musculoskeletal: Negative for musculoskeletal pain. Skin: Negative for rash, abrasions, lacerations, ecchymosis.Positive for cat  Bite to the left thumb. Neurological: Negative for headaches, focal weakness or numbness. 10-point ROS otherwise negative.  ____________________________________________   PHYSICAL EXAM:  VITAL SIGNS: ED Triage Vitals  Enc Vitals Group     BP 12/15/16 1944 (!) 147/82     Pulse Rate 12/15/16 1944 73     Resp 12/15/16 1944 18     Temp 12/15/16 1944 99 F (37.2 C)     Temp Source 12/15/16 1944 Oral     SpO2 12/15/16 1944 99 %     Weight 12/15/16 1944 260 lb (117.9 kg)     Height 12/15/16 1944 5\' 7"  (1.702 m)     Head Circumference --      Peak Flow --      Pain Score 12/15/16 1943 0     Pain Loc --      Pain Edu? --      Excl. in GC? --      Constitutional: Alert and oriented. Well appearing and in no acute distress. Eyes: Conjunctivae are normal. PERRL. EOMI. Head: Atraumatic. ENT:      Ears:       Nose: No congestion/rhinnorhea.      Mouth/Throat: Mucous membranes are moist.  Neck: No stridor.    Cardiovascular: Normal rate, regular rhythm. Normal S1 and S2.  Good peripheral circulation. Respiratory: Normal respiratory effort without tachypnea or retractions. Lungs CTAB. Good air entry to the bases with no decreased or absent breath sounds. Musculoskeletal: Full range of motion to all extremities. No gross deformities appreciated. Neurologic:  Normal speech  and language. No gross focal neurologic deficits are appreciated.  Skin:  Skin is warm, dry and intact. No rash noted.2 small puncture wounds are noted to the distal left thumb. No bleeding. No foreign body. Full range of motion Psychiatric: Mood and affect are normal. Speech and behavior are normal. Patient exhibits appropriate insight and judgement.   ____________________________________________   LABS (all labs ordered are listed, but only abnormal results are displayed)  Labs Reviewed - No data to display ____________________________________________  EKG   ____________________________________________  RADIOLOGY   No results found.  ____________________________________________    PROCEDURES  Procedure(s) performed:    Procedures    Medications - No data to display   ____________________________________________   INITIAL IMPRESSION /  ASSESSMENT AND PLAN / ED COURSE  Pertinent labs & imaging results that were available during my care of the patient were reviewed by me and considered in my medical decision making (see chart for details).  Review of the [place] CSRS was performed in accordance of the [facility] prior to dispensing any controlled drugs.     Patient's diagnosis is consistent with Bite to the left thumb. Patient will be placed on antibiotics prophylactically. Animal control has the animal and will call patient should there be any concern for rabies. She will hold off on rabies vaccination until she had a animal control.. Patient follow-up with primary care as needed. Patient is given ED precautions to return to the ED for any worsening or new symptoms.     ____________________________________________  FINAL CLINICAL IMPRESSION(S) / ED DIAGNOSES  Final diagnoses:  Cat bite, initial encounter      NEW MEDICATIONS STARTED DURING THIS VISIT:  New Prescriptions   AMOXICILLIN-CLAVULANATE (AUGMENTIN) 875-125 MG TABLET    Take 1 tablet by mouth 2 (two)  times daily.        This chart was dictated using voice recognition software/Dragon. Despite best efforts to proofread, errors can occur which can change the meaning. Any change was purely unintentional.    Racheal Patches, PA-C 12/15/16 2108    Willy Eddy, MD 12/16/16 [phone]

## 2016-12-15 NOTE — ED Notes (Signed)
Animal control already contacted in regards to the cat bite.  They have taken the kitten for quarantine.

## 2016-12-15 NOTE — ED Triage Notes (Signed)
Pt states that she noticed a kitten outside of work this evening and the kitten bit her. The [employer] have the animal in question for testing. Pt is ambulatory at this time with NAD.

## 2017-03-06 ENCOUNTER — Ambulatory Visit (INDEPENDENT_AMBULATORY_CARE_PROVIDER_SITE_OTHER): Payer: BLUE CROSS/BLUE SHIELD | Admitting: Psychiatry

## 2017-03-06 DIAGNOSIS — F411 Generalized anxiety disorder: Secondary | ICD-10-CM | POA: Diagnosis not present

## 2017-03-06 DIAGNOSIS — F331 Major depressive disorder, recurrent, moderate: Secondary | ICD-10-CM | POA: Diagnosis not present

## 2017-03-06 DIAGNOSIS — F902 Attention-deficit hyperactivity disorder, combined type: Secondary | ICD-10-CM | POA: Diagnosis not present

## 2017-03-06 MED ORDER — BUPROPION HCL ER (XL) 300 MG PO TB24: ORAL_TABLET | ORAL | 5 refills | Status: DC

## 2017-03-06 MED ORDER — CITALOPRAM HYDROBROMIDE 20 MG PO TABS: 20.0000 mg | ORAL_TABLET | Freq: Every day | ORAL | 5 refills | Status: DC

## 2017-03-06 MED ORDER — AMPHETAMINE-DEXTROAMPHET ER 10 MG PO CP24: 10.0000 mg | ORAL_CAPSULE | Freq: Two times a day (BID) | ORAL | 0 refills | Status: DC

## 2017-03-06 NOTE — Progress Notes (Signed)
Follow-up patient with chronic depression and ADHD. Her work has been stressing her out. Feels nervous some of the time but mostly focused on work. Otherwise attention is good. No side effects of medicine.  Neatly dressed and groomed. Reasonable eye contact. Normal psychomotor activity. Denies suicidal or homicidal ideation. No sign of psychosis.  Supportive counseling and review of medicine. Still benefiting from combination of Wellbutrin and Celexa. Adderall seems to be working well. Adderall written out is 3 one-month prescriptions and refill the antidepressants. Follow-up in 3 months.

## 2017-09-23 ENCOUNTER — Telehealth: Payer: Self-pay

## 2017-09-23 ENCOUNTER — Ambulatory Visit: Payer: BLUE CROSS/BLUE SHIELD | Admitting: Psychiatry

## 2017-09-23 ENCOUNTER — Other Ambulatory Visit: Payer: Self-pay

## 2017-09-23 ENCOUNTER — Encounter: Payer: Self-pay | Admitting: Psychiatry

## 2017-09-23 VITALS — BP 149/110 | HR 86 | Temp 98.5°F | Wt 258.8 lb

## 2017-09-23 DIAGNOSIS — F902 Attention-deficit hyperactivity disorder, combined type: Secondary | ICD-10-CM | POA: Diagnosis not present

## 2017-09-23 DIAGNOSIS — F411 Generalized anxiety disorder: Secondary | ICD-10-CM | POA: Diagnosis not present

## 2017-09-23 DIAGNOSIS — F331 Major depressive disorder, recurrent, moderate: Secondary | ICD-10-CM

## 2017-09-23 MED ORDER — BUPROPION HCL ER (XL) 300 MG PO TB24: 300.0000 mg | ORAL_TABLET | Freq: Every day | ORAL | 3 refills | Status: DC

## 2017-09-23 MED ORDER — AMPHETAMINE-DEXTROAMPHET ER 10 MG PO CP24: 10.0000 mg | ORAL_CAPSULE | Freq: Two times a day (BID) | ORAL | 0 refills | Status: DC

## 2017-09-23 MED ORDER — CITALOPRAM HYDROBROMIDE 20 MG PO TABS: ORAL_TABLET | ORAL | 3 refills | Status: DC

## 2017-09-23 MED ORDER — AMPHETAMINE-DEXTROAMPHET ER 10 MG PO CP24: 10.0000 mg | ORAL_CAPSULE | Freq: Every day | ORAL | 0 refills | Status: DC

## 2017-09-23 NOTE — Telephone Encounter (Signed)
adderall rx needs to be sent [facility] in [place].

## 2017-09-23 NOTE — Progress Notes (Signed)
Follow-up for this patient with ADHD and depression.  No new complaints.  Mood is good no sign of depression no suicidal thoughts.  Focus and attention are good.  Sleeping well.  No problems with anger outbursts.  No side effects to medicine.  Neatly dressed and groomed.  Good eye contact.  Normal psychomotor activity.  Speech normal rate tone and volume.  Affect euthymic.  Thoughts lucid.  No suicidal thinking no evidence of delusions or disorganized thinking.  No physical complaints currently no nausea no GI complaints no cardiac complaints  Supportive counseling review of medicine.  Supporting her decision to continue working on trying to get a new job.  Follow-up in 6 months.  Prescriptions completed.

## 2017-12-06 IMAGING — CR DG FINGER LITTLE 2+V*R*
3 series · 3 of 3 positions shown · non-contrast
Comparison: None in PACs

CLINICAL DATA: Puncture wound in the posterior metacarpophalangeal
joint region

EXAM:
RIGHT LITTLE FINGER 2+V

[finger ap]
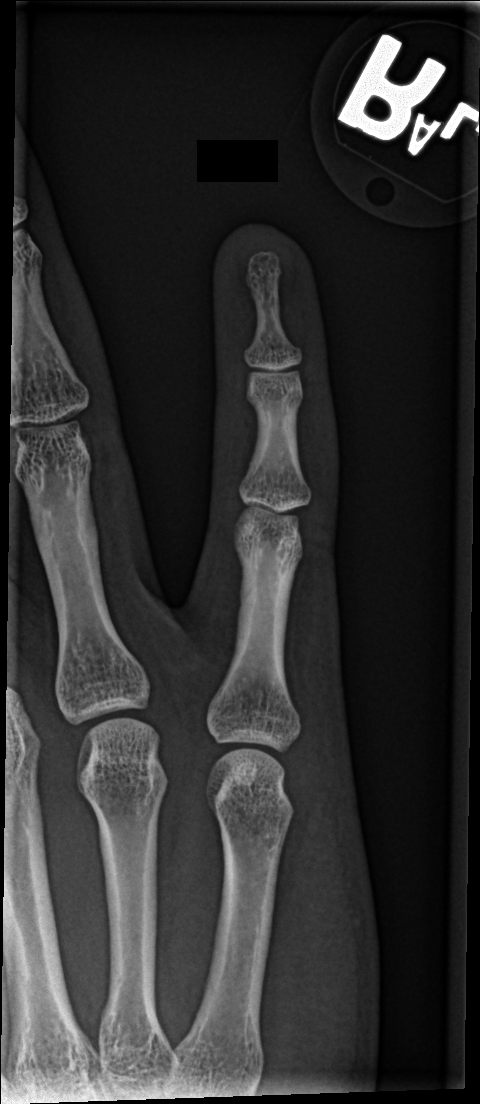

[finger obl]
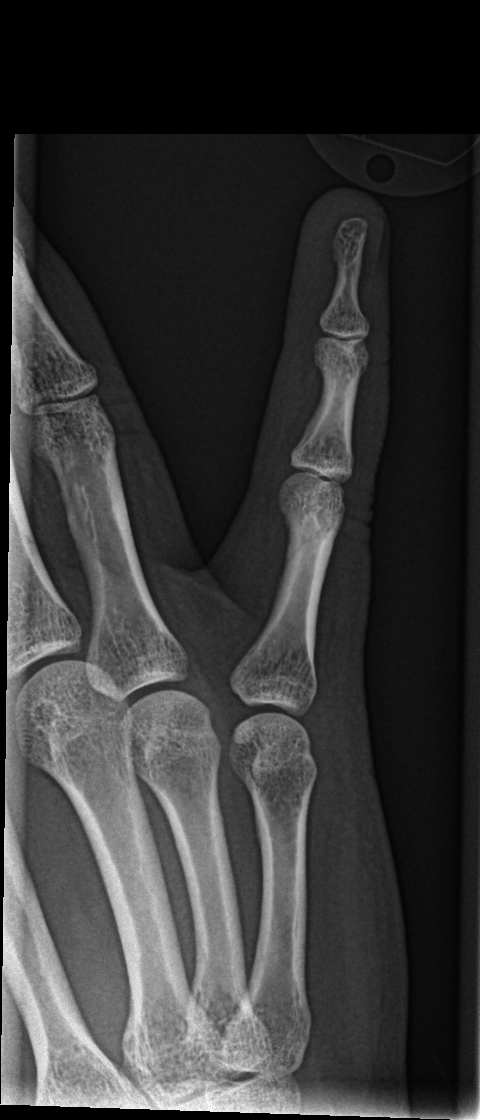

[finger lat]
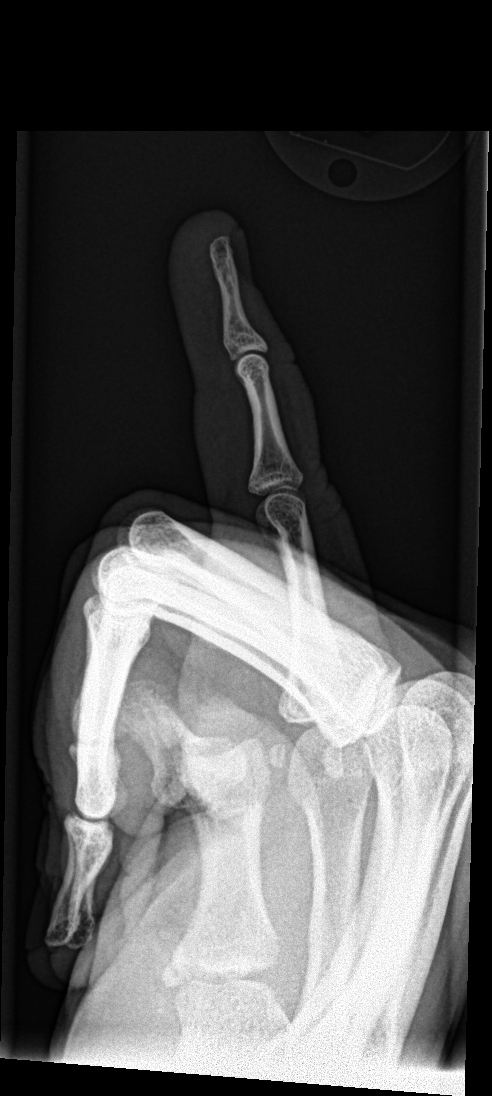

[3 of 3 positions shown; findings below may reference images not displayed]

FINDINGS: The bones are adequately mineralized. The joint spaces are
preserved. There is subtle irregularity of the tip of the fifth
finger consistent with recent injury. In the MCP joint region no
bony abnormalities are observed. No soft tissue foreign bodies are
observed either.
IMPRESSION: There is no acute soft tissue abnormality in the region of the
puncture wound in the metacarpophalangeal joint region. No bony
abnormality is observed.

## 2017-12-23 ENCOUNTER — Ambulatory Visit: Payer: BLUE CROSS/BLUE SHIELD | Admitting: Psychiatry

## 2017-12-31 ENCOUNTER — Other Ambulatory Visit: Payer: Self-pay | Admitting: Psychiatry

## 2017-12-31 MED ORDER — AMPHETAMINE-DEXTROAMPHET ER 10 MG PO CP24: 10.0000 mg | ORAL_CAPSULE | Freq: Two times a day (BID) | ORAL | 0 refills | Status: DC

## 2018-03-12 ENCOUNTER — Telehealth: Payer: Self-pay

## 2018-03-12 ENCOUNTER — Other Ambulatory Visit: Payer: Self-pay | Admitting: Psychiatry

## 2018-03-12 MED ORDER — AMPHETAMINE-DEXTROAMPHET ER 10 MG PO CP24: 10.0000 mg | ORAL_CAPSULE | Freq: Two times a day (BID) | ORAL | 0 refills | Status: DC

## 2018-03-12 MED ORDER — AMPHETAMINE-DEXTROAMPHET ER 10 MG PO CP24: 10.0000 mg | ORAL_CAPSULE | Freq: Every day | ORAL | 0 refills | Status: DC

## 2018-03-12 NOTE — Telephone Encounter (Signed)
pt called left messaget that she needs refill on her adderall pt states she has been out for a month.

## 2018-03-24 ENCOUNTER — Encounter: Payer: Self-pay | Admitting: Psychiatry

## 2018-03-24 ENCOUNTER — Other Ambulatory Visit: Payer: Self-pay

## 2018-03-24 ENCOUNTER — Ambulatory Visit: Payer: BLUE CROSS/BLUE SHIELD | Admitting: Psychiatry

## 2018-03-24 VITALS — BP 146/97 | HR 84 | Temp 97.9°F | Wt 265.8 lb

## 2018-03-24 DIAGNOSIS — F411 Generalized anxiety disorder: Secondary | ICD-10-CM | POA: Diagnosis not present

## 2018-03-24 DIAGNOSIS — F902 Attention-deficit hyperactivity disorder, combined type: Secondary | ICD-10-CM | POA: Diagnosis not present

## 2018-03-24 NOTE — Progress Notes (Signed)
Follow-up patient with ADHD.  No new complaints.  Mood is good.  Still has problems at her job but is functioning okay.  No new medical issues.  Neatly dressed and groomed.  Eye contact good psychomotor activity normal thoughts lucid no loosening of associations no delusions.  Cognitively intact.  No medicine side effects.  Patient recently got renewals of all of her stimulants.  We can follow-up in 6 months.  No change to medication as prescribed.

## 2018-04-15 ENCOUNTER — Other Ambulatory Visit: Payer: Self-pay | Admitting: Psychiatry

## 2018-04-16 ENCOUNTER — Telehealth: Payer: Self-pay

## 2018-04-16 ENCOUNTER — Other Ambulatory Visit: Payer: Self-pay | Admitting: Psychiatry

## 2018-04-16 MED ORDER — AMPHETAMINE-DEXTROAMPHET ER 10 MG PO CP24: 20.0000 mg | ORAL_CAPSULE | Freq: Two times a day (BID) | ORAL | 0 refills | Status: DC

## 2018-04-16 MED ORDER — AMPHETAMINE-DEXTROAMPHET ER 10 MG PO CP24: 10.0000 mg | ORAL_CAPSULE | Freq: Two times a day (BID) | ORAL | 0 refills | Status: DC

## 2018-04-16 NOTE — Telephone Encounter (Signed)
amphetamine-dextroamphetamine (ADDERALL XR) 10 MG 24 hr capsule  Medication  Date: 03/12/2018 Department: [facility] Ordering/Authorizing: Clapacs, Jackquline Denmark, MD  Order Providers   Prescribing Provider Encounter Provider  Clapacs, Jackquline Denmark, MD Clapacs, Jackquline Denmark, MD  Outpatient Medication Detail    Disp Refills Start End   amphetamine-dextroamphetamine (ADDERALL XR) 10 MG 24 hr capsule 60 capsule 0 03/12/2018    Sig - Route: Take 1 capsule (10 mg total) by mouth daily. - Oral   Sent to pharmacy as: amphetamine-dextroamphetamine (ADDERALL XR) 10 MG 24 hr capsule   Earliest Fill Date: 03/12/2018   Notes to Pharmacy: [facility] prescription after May 06 2018   E-Prescribing Status: Receipt confirmed by pharmacy (03/12/2018 8:09 PM EDT)

## 2018-04-16 NOTE — Telephone Encounter (Signed)
Done

## 2018-04-23 ENCOUNTER — Ambulatory Visit: Payer: BLUE CROSS/BLUE SHIELD | Admitting: Psychiatry

## 2018-05-18 ENCOUNTER — Other Ambulatory Visit: Payer: Self-pay | Admitting: Psychiatry

## 2018-05-20 ENCOUNTER — Telehealth: Payer: Self-pay

## 2018-05-20 ENCOUNTER — Other Ambulatory Visit: Payer: Self-pay | Admitting: Psychiatry

## 2018-05-20 MED ORDER — BUPROPION HCL ER (XL) 300 MG PO TB24: 300.0000 mg | ORAL_TABLET | Freq: Every day | ORAL | 3 refills | Status: DC

## 2018-05-20 MED ORDER — AMPHETAMINE-DEXTROAMPHET ER 10 MG PO CP24: 10.0000 mg | ORAL_CAPSULE | Freq: Two times a day (BID) | ORAL | 0 refills | Status: DC

## 2018-05-20 MED ORDER — CITALOPRAM HYDROBROMIDE 20 MG PO TABS: ORAL_TABLET | ORAL | 3 refills | Status: DC

## 2018-05-20 MED ORDER — AMPHETAMINE-DEXTROAMPHET ER 10 MG PO CP24: 20.0000 mg | ORAL_CAPSULE | Freq: Two times a day (BID) | ORAL | 0 refills | Status: DC

## 2018-05-20 NOTE — Telephone Encounter (Signed)
Will do!

## 2018-05-20 NOTE — Telephone Encounter (Signed)
Patient called requesting a refill on Bupropion, Citalopram, Adderall XR 10. [facility] [address]

## 2018-05-27 NOTE — Telephone Encounter (Signed)
Thanks

## 2018-07-23 ENCOUNTER — Other Ambulatory Visit: Payer: Self-pay

## 2018-07-23 ENCOUNTER — Encounter: Payer: Self-pay | Admitting: Psychiatry

## 2018-07-23 ENCOUNTER — Ambulatory Visit: Payer: BLUE CROSS/BLUE SHIELD | Admitting: Psychiatry

## 2018-07-23 VITALS — BP 146/77 | HR 85 | Temp 98.4°F | Wt 254.2 lb

## 2018-07-23 DIAGNOSIS — F902 Attention-deficit hyperactivity disorder, combined type: Secondary | ICD-10-CM | POA: Diagnosis not present

## 2018-07-23 DIAGNOSIS — F411 Generalized anxiety disorder: Secondary | ICD-10-CM | POA: Diagnosis not present

## 2018-07-23 DIAGNOSIS — F331 Major depressive disorder, recurrent, moderate: Secondary | ICD-10-CM

## 2018-07-23 MED ORDER — AMPHETAMINE-DEXTROAMPHET ER 10 MG PO CP24: 10.0000 mg | ORAL_CAPSULE | Freq: Two times a day (BID) | ORAL | 0 refills | Status: DC

## 2018-07-23 MED ORDER — AMPHETAMINE-DEXTROAMPHET ER 10 MG PO CP24: 20.0000 mg | ORAL_CAPSULE | Freq: Two times a day (BID) | ORAL | 0 refills | Status: DC

## 2018-07-23 MED ORDER — BUPROPION HCL ER (XL) 300 MG PO TB24: ORAL_TABLET | ORAL | 3 refills | Status: DC

## 2018-07-23 MED ORDER — CITALOPRAM HYDROBROMIDE 20 MG PO TABS: ORAL_TABLET | ORAL | 3 refills | Status: DC

## 2018-07-23 NOTE — Progress Notes (Signed)
Follow-up for this 28 year old woman with attention deficit disorder and chronic depression.  Patient has no specific new complaints.  Mood has been stable.  No major depression.  Chronic anxiety which is reactive and well controlled.  No new physical symptoms.  Attention and focus are stable.  Sleeping well.  Appetite fine.  Neatly dressed and groomed.  Good eye contact normal psychomotor activity.  Speech normal rate tone and volume.  Affect euthymic.  Thoughts lucid.  No suicidal or homicidal thought and no psychosis.  Renew medications.  Supportive counseling.  No need to change anything at this point.  Follow-up in 3 months

## 2018-08-10 ENCOUNTER — Telehealth: Payer: Self-pay

## 2018-08-10 NOTE — Telephone Encounter (Signed)
Pharmacy called left message that one of the rx for this month was written as take 2  By mouth 2 times daily instead of take 1 tablet 2 times daily.   amphetamine-dextroamphetamine (ADDERALL XR) 10 MG 24 hr capsule  Medication  Date: 07/23/2018 Department: [facility] Ordering/Authorizing: Toni Amend Jackquline Denmark, MD  Order Providers   Prescribing Provider Encounter Provider  Clapacs, Jackquline Denmark, MD Clapacs, Jackquline Denmark, MD  Outpatient Medication Detail    Disp Refills Start End   amphetamine-dextroamphetamine (ADDERALL XR) 10 MG 24 hr capsule 60 capsule 0 07/23/2018    Sig - Route: Take 2 capsules (20 mg total) by mouth 2 (two) times daily. - Oral   Sent to pharmacy as: amphetamine-dextroamphetamine (ADDERALL XR) 10 MG 24 hr capsule   Earliest Fill Date: 07/23/2018   Notes to Pharmacy: fill with either brand name or generic, whichever one the patient's insurance plan prefers. If the insurance prefers to pay for brand name Adderall then please fill this with brand name Adderall.   E-Prescribing Status: Receipt confirmed by pharmacy (07/23/2018 5:04 PM EST)

## 2018-08-11 ENCOUNTER — Other Ambulatory Visit: Payer: Self-pay | Admitting: Psychiatry

## 2018-08-11 MED ORDER — AMPHETAMINE-DEXTROAMPHET ER 10 MG PO CP24: 10.0000 mg | ORAL_CAPSULE | Freq: Two times a day (BID) | ORAL | 0 refills | Status: DC

## 2018-08-11 NOTE — Telephone Encounter (Signed)
done

## 2018-10-13 ENCOUNTER — Other Ambulatory Visit: Payer: Self-pay | Admitting: Psychiatry

## 2018-10-13 MED ORDER — AMPHETAMINE-DEXTROAMPHET ER 10 MG PO CP24: 10.0000 mg | ORAL_CAPSULE | Freq: Two times a day (BID) | ORAL | 0 refills | Status: DC

## 2018-10-13 MED ORDER — BUPROPION HCL ER (XL) 300 MG PO TB24: 300.0000 mg | ORAL_TABLET | Freq: Every day | ORAL | 3 refills | Status: DC

## 2018-10-13 MED ORDER — AMPHETAMINE-DEXTROAMPHET ER 10 MG PO CP24: 20.0000 mg | ORAL_CAPSULE | Freq: Two times a day (BID) | ORAL | 0 refills | Status: DC

## 2018-10-13 MED ORDER — CITALOPRAM HYDROBROMIDE 20 MG PO TABS: ORAL_TABLET | ORAL | 3 refills | Status: DC

## 2018-10-20 ENCOUNTER — Other Ambulatory Visit: Payer: Self-pay

## 2018-10-20 ENCOUNTER — Ambulatory Visit: Payer: BLUE CROSS/BLUE SHIELD | Admitting: Psychiatry

## 2018-12-09 ENCOUNTER — Other Ambulatory Visit: Payer: Self-pay | Admitting: Psychiatry

## 2018-12-09 MED ORDER — AMPHETAMINE-DEXTROAMPHET ER 10 MG PO CP24: 10.0000 mg | ORAL_CAPSULE | Freq: Two times a day (BID) | ORAL | 0 refills | Status: DC

## 2018-12-10 ENCOUNTER — Other Ambulatory Visit: Payer: Self-pay

## 2018-12-10 ENCOUNTER — Encounter: Payer: Self-pay | Admitting: Psychiatry

## 2018-12-10 ENCOUNTER — Ambulatory Visit (INDEPENDENT_AMBULATORY_CARE_PROVIDER_SITE_OTHER): Payer: BLUE CROSS/BLUE SHIELD | Admitting: Psychiatry

## 2018-12-10 DIAGNOSIS — F902 Attention-deficit hyperactivity disorder, combined type: Secondary | ICD-10-CM

## 2018-12-10 DIAGNOSIS — F331 Major depressive disorder, recurrent, moderate: Secondary | ICD-10-CM

## 2018-12-10 MED ORDER — BUPROPION HCL ER (XL) 300 MG PO TB24: ORAL_TABLET | ORAL | 3 refills | Status: DC

## 2018-12-10 MED ORDER — CITALOPRAM HYDROBROMIDE 20 MG PO TABS: ORAL_TABLET | ORAL | 3 refills | Status: DC

## 2018-12-10 NOTE — Progress Notes (Signed)
Follow-up for this woman with ADHD.  Patient was reached by telephone.  Appropriate interaction.  No new complaints.  Denied any problems with focus or attention.  No suicidal ideation.  No cognitive problems no physical problems.  Alert and oriented.  Appropriate interaction.  Upbeat affect.  Thoughts lucid no problems with thought disorganization.  Reviewed medication.  Continue Adderall current prescription follow-up in another 3 months.

## 2019-02-10 ENCOUNTER — Other Ambulatory Visit: Payer: Self-pay | Admitting: Psychiatry

## 2019-02-10 MED ORDER — AMPHETAMINE-DEXTROAMPHET ER 10 MG PO CP24: 10.0000 mg | ORAL_CAPSULE | Freq: Two times a day (BID) | ORAL | 0 refills | Status: DC

## 2019-04-25 ENCOUNTER — Other Ambulatory Visit: Payer: Self-pay | Admitting: Psychiatry

## 2019-04-29 ENCOUNTER — Telehealth: Payer: Self-pay

## 2019-04-29 ENCOUNTER — Other Ambulatory Visit: Payer: Self-pay | Admitting: Psychiatry

## 2019-04-29 MED ORDER — BUPROPION HCL ER (XL) 300 MG PO TB24: 300.0000 mg | ORAL_TABLET | Freq: Every day | ORAL | 3 refills | Status: DC

## 2019-04-29 MED ORDER — AMPHETAMINE-DEXTROAMPHET ER 10 MG PO CP24: 10.0000 mg | ORAL_CAPSULE | Freq: Two times a day (BID) | ORAL | 0 refills | Status: DC

## 2019-04-29 MED ORDER — CITALOPRAM HYDROBROMIDE 20 MG PO TABS: ORAL_TABLET | ORAL | 3 refills | Status: DC

## 2019-04-29 NOTE — Telephone Encounter (Signed)
pt called states she needs refills on all medications. adderall, wellbutrin, celexa

## 2019-05-04 ENCOUNTER — Ambulatory Visit (INDEPENDENT_AMBULATORY_CARE_PROVIDER_SITE_OTHER): Payer: BLUE CROSS/BLUE SHIELD | Admitting: Psychiatry

## 2019-05-04 ENCOUNTER — Other Ambulatory Visit: Payer: Self-pay

## 2019-05-04 DIAGNOSIS — F411 Generalized anxiety disorder: Secondary | ICD-10-CM | POA: Diagnosis not present

## 2019-05-04 DIAGNOSIS — F902 Attention-deficit hyperactivity disorder, combined type: Secondary | ICD-10-CM | POA: Diagnosis not present

## 2019-05-04 NOTE — Progress Notes (Signed)
Follow-up for this patient with ADHD and chronic dysthymia and anxiety.  Patient reached by telephone.  She was appropriate and talkative and forthcoming.  Talks about her anxiety of her stress at work.  Having to be around the public during the coronavirus situation.  Other frustrations at work.  Overall mood however remains stable no major depression.  She denies any suicidal thoughts.  She gets confused and feels foggy when she is off of her Adderall but when she is back on it she is doing okay thinking more clearly.  No side effects.  Sleeping well at night.  Alert and oriented.  Appropriately interactive.  Appropriate affect.  Thoughts lucid no evidence of dangerousness.  Good judgment and insight  Refilled all medicines with the stimulants refilled for 3 months.  Patient is to call when she is getting near the end of that and we can call in more electronic prescriptions for the stimulants follow-up otherwise in 6 months.

## 2019-08-05 ENCOUNTER — Telehealth: Payer: Self-pay

## 2019-08-05 MED ORDER — AMPHETAMINE-DEXTROAMPHET ER 10 MG PO CP24: 10.0000 mg | ORAL_CAPSULE | Freq: Two times a day (BID) | ORAL | 0 refills | Status: DC

## 2019-08-05 NOTE — Telephone Encounter (Signed)
Pt called states that she needs refills on both adderalls

## 2019-08-05 NOTE — Telephone Encounter (Signed)
Chart reviewed and supply for Adderall for one month sent. PDMP reviewed.

## 2019-08-12 ENCOUNTER — Telehealth: Payer: Self-pay

## 2019-08-12 NOTE — Telephone Encounter (Signed)
went online to [email or web address] and submited the prior auth for the adderall. - pending review.

## 2019-08-12 NOTE — Telephone Encounter (Signed)
pt called left a message that it was an issue with her medication adderall.

## 2019-08-12 NOTE — Telephone Encounter (Signed)
a prior Berkley Harvey is needed for medication.

## 2019-08-16 NOTE — Telephone Encounter (Signed)
Medication management - Fax received from [employer] that patient's Addreall XR was approved on 08/15/19 - 08/14/2020 for #60 for 30 days of the 10 mg.

## 2019-09-08 ENCOUNTER — Other Ambulatory Visit: Payer: Self-pay | Admitting: Psychiatry

## 2019-09-14 ENCOUNTER — Telehealth: Payer: Self-pay

## 2019-09-14 NOTE — Telephone Encounter (Signed)
Patient called requesting a refill on her Bupropion 300mg  and her Citalopram 20mg  to be sent to [facility] on [address] in [place]. Thank you.

## 2019-09-15 ENCOUNTER — Other Ambulatory Visit: Payer: Self-pay | Admitting: Psychiatry

## 2019-09-17 ENCOUNTER — Other Ambulatory Visit: Payer: Self-pay | Admitting: Psychiatry

## 2019-09-17 MED ORDER — CITALOPRAM HYDROBROMIDE 20 MG PO TABS: ORAL_TABLET | ORAL | 3 refills | Status: DC

## 2019-09-17 MED ORDER — BUPROPION HCL ER (XL) 300 MG PO TB24: ORAL_TABLET | ORAL | 3 refills | Status: DC

## 2019-10-22 ENCOUNTER — Other Ambulatory Visit: Payer: Self-pay | Admitting: Psychiatry

## 2019-10-22 MED ORDER — AMPHETAMINE-DEXTROAMPHET ER 10 MG PO CP24: 10.0000 mg | ORAL_CAPSULE | Freq: Two times a day (BID) | ORAL | 0 refills | Status: DC

## 2019-10-26 ENCOUNTER — Telehealth: Payer: Self-pay | Admitting: Psychiatry

## 2019-11-02 ENCOUNTER — Other Ambulatory Visit: Payer: Self-pay

## 2019-11-02 ENCOUNTER — Encounter: Payer: Self-pay | Admitting: Psychiatry

## 2019-11-02 ENCOUNTER — Telehealth (INDEPENDENT_AMBULATORY_CARE_PROVIDER_SITE_OTHER): Payer: 59 | Admitting: Psychiatry

## 2019-11-02 DIAGNOSIS — F331 Major depressive disorder, recurrent, moderate: Secondary | ICD-10-CM

## 2019-11-02 DIAGNOSIS — F902 Attention-deficit hyperactivity disorder, combined type: Secondary | ICD-10-CM | POA: Diagnosis not present

## 2019-11-02 DIAGNOSIS — F411 Generalized anxiety disorder: Secondary | ICD-10-CM | POA: Diagnosis not present

## 2019-11-02 NOTE — Progress Notes (Signed)
Follow-up for this patient with ADHD and anxiety and depression.  Reach patient by telephone.  Identified self and patient.  She has no new complaints.  Mood has been stable.  No return of depression.  Anxiety is manageable.  No side effects from medicine.  Continues to have improved focus and attention.  Patient was on time alert and oriented appropriate in her interaction.  No evidence of psychosis.  Denies suicidal ideation or major mood swings.  Reviewed medication and dosage.  Follow-up in another 3 months after ensuring that medicine should be up-to-date.  Total time on the phone 10 minutes

## 2019-11-13 ENCOUNTER — Other Ambulatory Visit: Payer: Self-pay | Admitting: Psychiatry

## 2019-11-14 ENCOUNTER — Other Ambulatory Visit: Payer: Self-pay | Admitting: Psychiatry

## 2020-02-03 ENCOUNTER — Other Ambulatory Visit: Payer: Self-pay

## 2020-02-03 ENCOUNTER — Telehealth (INDEPENDENT_AMBULATORY_CARE_PROVIDER_SITE_OTHER): Payer: 59 | Admitting: Psychiatry

## 2020-02-03 ENCOUNTER — Encounter: Payer: Self-pay | Admitting: Psychiatry

## 2020-02-03 DIAGNOSIS — F902 Attention-deficit hyperactivity disorder, combined type: Secondary | ICD-10-CM | POA: Diagnosis not present

## 2020-02-03 DIAGNOSIS — F331 Major depressive disorder, recurrent, moderate: Secondary | ICD-10-CM

## 2020-02-03 DIAGNOSIS — F411 Generalized anxiety disorder: Secondary | ICD-10-CM | POA: Diagnosis not present

## 2020-02-03 MED ORDER — AMPHETAMINE-DEXTROAMPHET ER 10 MG PO CP24: 10.0000 mg | ORAL_CAPSULE | Freq: Two times a day (BID) | ORAL | 0 refills | Status: DC

## 2020-02-03 MED ORDER — BUPROPION HCL ER (XL) 300 MG PO TB24: ORAL_TABLET | ORAL | 3 refills | Status: AC

## 2020-02-03 MED ORDER — CITALOPRAM HYDROBROMIDE 20 MG PO TABS: ORAL_TABLET | ORAL | 3 refills | Status: AC

## 2020-02-03 NOTE — Progress Notes (Signed)
Follow-up for this 29 year old woman with ADHD and chronic depression.  Mood is been down recently.  Seems to have a general sense of hopelessness especially since her job is not changing and continues to be frustrating.  Also feels like her use of the Adderall is less than perfect because she wishes she could use smaller amounts spread out over a longer.  We talked about how 1 option was to break the capsules open and she is aware of that and may try and make some changes with it.  Otherwise however things seem to be pretty stable with her.  Alert and oriented.  Lucid thought.  Denies suicidal or homicidal ideation.  Clear thinking seems to be rational.  Renewed Adderall for 3 months as well as the antidepressants.  Supportive therapy and review of medicine.  Follow-up 3 months.

## 2020-04-26 ENCOUNTER — Other Ambulatory Visit: Payer: Self-pay | Admitting: Psychiatry

## 2020-04-26 MED ORDER — AMPHETAMINE-DEXTROAMPHET ER 10 MG PO CP24: 10.0000 mg | ORAL_CAPSULE | Freq: Two times a day (BID) | ORAL | 0 refills | Status: DC

## 2020-04-26 MED ORDER — BUPROPION HCL ER (XL) 300 MG PO TB24: ORAL_TABLET | ORAL | 3 refills | Status: DC

## 2020-04-26 MED ORDER — CITALOPRAM HYDROBROMIDE 20 MG PO TABS: ORAL_TABLET | ORAL | 3 refills | Status: AC

## 2020-07-18 ENCOUNTER — Other Ambulatory Visit: Payer: Self-pay | Admitting: Psychiatry

## 2020-07-18 MED ORDER — AMPHETAMINE-DEXTROAMPHET ER 10 MG PO CP24: 10.0000 mg | ORAL_CAPSULE | Freq: Two times a day (BID) | ORAL | 0 refills | Status: DC

## 2020-07-18 MED ORDER — AMPHETAMINE-DEXTROAMPHET ER 10 MG PO CP24
10.0000 mg | ORAL_CAPSULE | Freq: Two times a day (BID) | ORAL | 0 refills | Status: DC
Start: 2020-07-18 — End: 2020-10-19

## 2020-10-19 ENCOUNTER — Telehealth (HOSPITAL_BASED_OUTPATIENT_CLINIC_OR_DEPARTMENT_OTHER): Payer: 59 | Admitting: Psychiatry

## 2020-10-19 ENCOUNTER — Other Ambulatory Visit: Payer: Self-pay

## 2020-10-19 DIAGNOSIS — F339 Major depressive disorder, recurrent, unspecified: Secondary | ICD-10-CM | POA: Diagnosis not present

## 2020-10-19 DIAGNOSIS — F331 Major depressive disorder, recurrent, moderate: Secondary | ICD-10-CM

## 2020-10-19 DIAGNOSIS — F411 Generalized anxiety disorder: Secondary | ICD-10-CM

## 2020-10-19 DIAGNOSIS — F902 Attention-deficit hyperactivity disorder, combined type: Secondary | ICD-10-CM

## 2020-10-19 MED ORDER — BUPROPION HCL ER (XL) 300 MG PO TB24: ORAL_TABLET | ORAL | 5 refills | Status: DC

## 2020-10-19 MED ORDER — AMPHETAMINE-DEXTROAMPHET ER 10 MG PO CP24: 10.0000 mg | ORAL_CAPSULE | Freq: Two times a day (BID) | ORAL | 0 refills | Status: DC

## 2020-10-19 MED ORDER — CITALOPRAM HYDROBROMIDE 20 MG PO TABS: ORAL_TABLET | ORAL | 5 refills | Status: DC

## 2020-10-19 MED ORDER — AMPHETAMINE-DEXTROAMPHET ER 10 MG PO CP24
10.0000 mg | ORAL_CAPSULE | Freq: Two times a day (BID) | ORAL | 0 refills | Status: DC
Start: 2020-10-19 — End: 2021-01-08

## 2020-10-19 NOTE — Progress Notes (Signed)
Virtual Visit via Telephone Note  I connected with Hannah Mendez on 10/19/20 at  1:00 PM EDT by telephone and verified that I am speaking with the correct person using two identifiers.  Location: Patient: Home Provider: Hospital   I discussed the limitations, risks, security and privacy concerns of performing an evaluation and management service by telephone and the availability of in person appointments. I also discussed with the patient that there may be a patient responsible charge related to this service. The patient expressed understanding and agreed to proceed.   History of Present Illness: Reach patient by telephone.  She was on time and appropriate.  Patient has no specific new complaint.  We talked about how she has changed jobs and is looking forward to it and excited about it.  Mood has been stable without any return of major depression.  Focus and concentration are good.  No side effects reported from the Adderall.    Observations/Objective: Alert and oriented and appropriate.  Reactive affect and good mood.  No evidence of loosening of associations or delusions no report of any psychotic symptoms   Assessment and Plan: Stable and doing well tolerating medication.  Reviewed medication and treatment plan.  No change to orders everything reordered now and we can check up in another 6 months.   Follow Up Instructions:    I discussed the assessment and treatment plan with the patient. The patient was provided an opportunity to ask questions and all were answered. The patient agreed with the plan and demonstrated an understanding of the instructions.   The patient was advised to call back or seek an in-person evaluation if the symptoms worsen or if the condition fails to improve as anticipated.  I provided 20 minutes of non-face-to-face time during this encounter.   Mordecai Rasmussen, MD

## 2020-10-27 ENCOUNTER — Telehealth: Payer: Self-pay

## 2020-10-27 NOTE — Telephone Encounter (Signed)
prior auth needed for the adderall xr.  prior auth was submitted and approved for 10-26-20 to  10-25-21

## 2021-01-08 ENCOUNTER — Other Ambulatory Visit: Payer: Self-pay | Admitting: Psychiatry

## 2021-01-08 MED ORDER — AMPHETAMINE-DEXTROAMPHET ER 10 MG PO CP24: 10.0000 mg | ORAL_CAPSULE | Freq: Two times a day (BID) | ORAL | 0 refills | Status: AC

## 2021-01-08 MED ORDER — CITALOPRAM HYDROBROMIDE 20 MG PO TABS: ORAL_TABLET | ORAL | 5 refills | Status: AC

## 2021-01-08 MED ORDER — BUPROPION HCL ER (XL) 300 MG PO TB24: ORAL_TABLET | ORAL | 5 refills | Status: AC

## 2021-02-28 ENCOUNTER — Other Ambulatory Visit: Payer: Self-pay

## 2021-02-28 ENCOUNTER — Emergency Department
Admission: EM | Admit: 2021-02-28 | Discharge: 2021-02-28 | Disposition: A | Discharge status: Home / Self Care | Payer: 59 | Attending: Emergency Medicine | Admitting: Emergency Medicine

## 2021-02-28 ENCOUNTER — Encounter: Payer: Self-pay | Admitting: Emergency Medicine

## 2021-02-28 DIAGNOSIS — S86811A Strain of other muscle(s) and tendon(s) at lower leg level, right leg, initial encounter: Secondary | ICD-10-CM

## 2021-02-28 DIAGNOSIS — F17210 Nicotine dependence, cigarettes, uncomplicated: Secondary | ICD-10-CM | POA: Insufficient documentation

## 2021-02-28 DIAGNOSIS — S86911A Strain of unspecified muscle(s) and tendon(s) at lower leg level, right leg, initial encounter: Secondary | ICD-10-CM | POA: Insufficient documentation

## 2021-02-28 DIAGNOSIS — X509XXA Other and unspecified overexertion or strenuous movements or postures, initial encounter: Secondary | ICD-10-CM | POA: Diagnosis not present

## 2021-02-28 DIAGNOSIS — S8991XA Unspecified injury of right lower leg, initial encounter: Secondary | ICD-10-CM | POA: Diagnosis present

## 2021-02-28 NOTE — ED Provider Notes (Signed)
[facility] Emergency Department Provider Note  ____________________________________________  Time seen: Approximately 12:00 PM  I have reviewed the triage vital signs and the nursing notes.   HISTORY  Chief Complaint Leg Pain    HPI Hannah Mendez is a 30 y.o. female with a past history of anxiety and depression who comes ED complaining of sudden right leg pain in the calf, radiates inferiorly a little bit to the lower calf, feels like burning pain, worse with standing bearing weight or walking.  Better with sitting and taking weight off of the leg.  No fall, no recent trauma.  No recent hospitalization or surgery or long travel/immobility.  No history of DVT or PE.    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 Authorizing Provider  amphetamine-dextroamphetamine (ADDERALL XR) 10 MG 24 hr capsule Take 1 capsule (10 mg total) by mouth 2 (two) times daily. 01/08/21   Clapacs, Jackquline Denmark, MD  amphetamine-dextroamphetamine (ADDERALL XR) 10 MG 24 hr capsule Take 1 capsule (10 mg total) by mouth 2 (two) times daily. 01/08/21   Clapacs, Jackquline Denmark, MD  amphetamine-dextroamphetamine (ADDERALL XR) 10 MG 24 hr capsule Take 1 capsule (10 mg total) by mouth 2 (two) times daily. 01/08/21   Clapacs, Jackquline Denmark, MD  buPROPion (WELLBUTRIN XL) 300 MG 24 hr tablet TAKE 1  TABLET(300 MG) BY MOUTH DAILY 02/03/20   Clapacs, Jackquline Denmark, MD  buPROPion (WELLBUTRIN XL) 300 MG 24 hr tablet TAKE 1 TABLET(300 MG) BY MOUTH DAILY 01/08/21   Clapacs, Jackquline Denmark, MD  cetirizine (ZYRTEC) 10 MG tablet Take 10 mg by mouth daily.    [provider]  citalopram (CELEXA) 20 MG tablet 1 po daily 02/03/20   Clapacs, Jackquline Denmark, MD  citalopram (CELEXA) 20 MG tablet TAKE 1 TABLET BY MOUTH EVERY DAY AND 1 TABLET EXTRA PER DAY DURING MENSTRUAL 04/26/20   Clapacs, Jackquline Denmark, MD  citalopram (CELEXA) 20 MG tablet TAKE 1 TABLET BY MOUTH DAILY AND 1 EXTRA TABLET DAILY DURING MENSTRUATION 01/08/21   Clapacs, Jackquline Denmark, MD  triamcinolone (NASACORT ALLERGY 24HR CHILDREN) 55 MCG/ACT AERO nasal inhaler Place 2 sprays into the nose daily.    [provider]     Allergies Patient has no known allergies.   Family History  Problem Relation Age of Onset   Anxiety disorder Mother    Depression Mother    OCD Mother    Thyroid disease Maternal Grandmother    Thyroid disease Maternal Aunt     Social History Social History   Tobacco Use   Smoking status: Every Day    Packs/day: 0.50    Types: Cigarettes    Start date: 12/05/2009   Smokeless tobacco: Never  Substance Use Topics   Alcohol use: Yes    Alcohol/week: 44.0 standard drinks    Types: 14 Glasses of wine, 24  Cans of beer, 6 Shots of liquor per week   Drug use: Yes    Types: Marijuana    Comment: EVERYNIGHT    Review of Systems  Constitutional:   No fever or chills.   Cardiovascular:   No chest pain or syncope. Respiratory:   No dyspnea or cough. Gastrointestinal:   Negative for abdominal pain, vomiting and diarrhea.  Musculoskeletal: Right calf pain as above All other systems reviewed and are negative except as documented above in ROS and HPI.  ____________________________________________   PHYSICAL EXAM:  VITAL SIGNS: ED Triage Vitals  Enc Vitals Group     BP 02/28/21 1111 (!) 146/102     Pulse Rate 02/28/21 1111 70      Resp 02/28/21 1111 17     Temp 02/28/21 1111 99.2 F (37.3 C)     Temp Source 02/28/21 1111 Oral     SpO2 02/28/21 1111 98 %     Weight 02/28/21 1048 254 lb 3.1 oz (115.3 kg)     Height 02/28/21 1048 5\' 7"  (1.702 m)     Head Circumference --      Peak Flow --      Pain Score 02/28/21 1047 2     Pain Loc --      Pain Edu? --      Excl. in GC? --     Vital signs reviewed, nursing assessments reviewed.   Constitutional:   Alert and oriented. Non-toxic appearance. Eyes:   Conjunctivae are normal. EOMI. ENT      Head:   Normocephalic and atraumatic.            Neck:   No meningismus. Full ROM.  Cardiovascular:   RRR.  Normal DP pulse.  Cap refill less than 2 seconds. Respiratory:   Normal respiratory effort without tachypnea/retractions. Musculoskeletal:   Normal range of motion in all extremities.  No edema.  There is tenderness and muscular tenseness in the medial gastroc muscle belly.  No other tenderness.  Achilles tendon is intact.  No crepitus.  Symmetric calf circumference. Neurologic:   Normal speech and language.  Motor grossly intact. No acute focal neurologic deficits are appreciated.  Skin:    Skin is warm, dry and intact. No rash noted.  No wounds.  ____________________________________________    LABS (pertinent positives/negatives) (all labs ordered are listed, but only abnormal results are displayed) Labs Reviewed - No data to display ____________________________________________   EKG  ____________________________________________    RADIOLOGY  No results found.  ____________________________________________   PROCEDURES Procedures  ____________________________________________  CLINICAL IMPRESSION / ASSESSMENT AND PLAN / ED COURSE  Pertinent labs & imaging results that were available during my care of the patient were reviewed by me and considered in my medical decision making (see chart for details).  Hannah Mendez was evaluated in  Emergency Department on 02/28/2021 for the symptoms described in the history of present illness. She was evaluated in the context of the global COVID-19 pandemic, which necessitated consideration that the patient might be at risk for infection with the SARS-CoV-2 virus that causes COVID-19. Institutional protocols and algorithms that pertain to the evaluation of patients at risk for COVID-19 are in a state of rapid change based on information released by regulatory bodies including the CDC and federal and state organizations. These policies and algorithms were followed during the patient's care in the ED.   Patient presents with right calf pain that started when she was standing up from bed this morning.  Doubt DVT, infection, vascular occlusion, Achilles tendon rupture, fracture.  Joints are stable.  Symptoms and exam are consistent with a strain/partial tear of the right calf.  Crutches, Ace wrap, NSAIDs, ice, elevation, follow-up PCP.  Work note provided.      ____________________________________________   FINAL CLINICAL IMPRESSION(S) / ED DIAGNOSES    Final diagnoses:  Strain of calf muscle, right, initial encounter     ED Discharge Orders     None       Portions of this note were generated with dragon dictation software. Dictation errors may occur despite best attempts at proofreading.   Sharman Cheek, MD 02/28/21 [phone]

## 2021-02-28 NOTE — ED Triage Notes (Signed)
Pt comes into the ED via POV c/o right leg pain.  Pt states that she stood up from bed and heard a "pop" in her right calf and now she is unable to bear a lot of weight on the leg.  Pt denies any known injury to the leg.

## 2021-02-28 NOTE — ED Notes (Signed)
See triage note  presents with pain to right lower leg  states she heard a pop when she stood up  unable to bear full wt  denies any falls
